# Patient Record
Sex: Female | Born: 1935 | Race: White | Hispanic: No | Smoking: Never smoker
Health system: Southern US, Community
[De-identification: ages and names within clinical notes are randomized; demographics above are authoritative.]

## PROBLEM LIST (undated history)

## (undated) DIAGNOSIS — I10 Essential (primary) hypertension: Secondary | ICD-10-CM

## (undated) DIAGNOSIS — E785 Hyperlipidemia, unspecified: Secondary | ICD-10-CM

## (undated) DIAGNOSIS — E079 Disorder of thyroid, unspecified: Secondary | ICD-10-CM

---

## 2008-02-21 ENCOUNTER — Emergency Department (HOSPITAL_COMMUNITY): Admission: EM | Admit: 2008-02-21 | Discharge: 2008-02-21 | Payer: Self-pay | Admitting: Family Medicine

## 2009-01-10 ENCOUNTER — Ambulatory Visit: Payer: Self-pay | Admitting: Diagnostic Radiology

## 2009-01-10 ENCOUNTER — Emergency Department (HOSPITAL_BASED_OUTPATIENT_CLINIC_OR_DEPARTMENT_OTHER): Admission: EM | Admit: 2009-01-10 | Discharge: 2009-01-10 | Payer: Self-pay | Admitting: Emergency Medicine

## 2010-11-30 LAB — CBC
HCT: 43.6 % (ref 36.0–46.0)
Hemoglobin: 15 g/dL (ref 12.0–15.0)
RBC: 4.85 MIL/uL (ref 3.87–5.11)
RDW: 12.1 % (ref 11.5–15.5)
WBC: 11.3 10*3/uL — ABNORMAL HIGH (ref 4.0–10.5)

## 2010-11-30 LAB — URINALYSIS, ROUTINE W REFLEX MICROSCOPIC
Bilirubin Urine: NEGATIVE
Ketones, ur: 15 mg/dL — AB
Nitrite: NEGATIVE
Protein, ur: NEGATIVE mg/dL

## 2010-11-30 LAB — DIFFERENTIAL
Eosinophils Relative: 0 % (ref 0–5)
Lymphocytes Relative: 11 % — ABNORMAL LOW (ref 12–46)
Lymphs Abs: 1.2 10*3/uL (ref 0.7–4.0)
Monocytes Absolute: 0.4 10*3/uL (ref 0.1–1.0)
Monocytes Relative: 4 % (ref 3–12)

## 2010-11-30 LAB — BASIC METABOLIC PANEL
GFR calc Af Amer: >60 mL/min (ref >60)
GFR calc non Af Amer: >60 mL/min (ref >60)
Glucose, Bld: 186 mg/dL — ABNORMAL HIGH (ref 70–99)
Potassium: 3.3 mEq/L — ABNORMAL LOW (ref 3.5–5.1)
Sodium: 139 mEq/L (ref 135–145)

## 2010-11-30 LAB — URINE CULTURE: Colony Count: 40000

## 2010-11-30 LAB — RAPID STREP SCREEN (MED CTR MEBANE ONLY): Streptococcus, Group A Screen (Direct): POSITIVE — AB

## 2012-03-16 ENCOUNTER — Encounter (HOSPITAL_BASED_OUTPATIENT_CLINIC_OR_DEPARTMENT_OTHER): Payer: Self-pay

## 2012-03-16 ENCOUNTER — Emergency Department (HOSPITAL_BASED_OUTPATIENT_CLINIC_OR_DEPARTMENT_OTHER)
Admission: EM | Admit: 2012-03-16 | Discharge: 2012-03-16 | Disposition: A | Discharge status: Home / Self Care | Payer: PRIVATE HEALTH INSURANCE | Attending: Emergency Medicine | Admitting: Emergency Medicine

## 2012-03-16 ENCOUNTER — Emergency Department (HOSPITAL_BASED_OUTPATIENT_CLINIC_OR_DEPARTMENT_OTHER): Payer: PRIVATE HEALTH INSURANCE

## 2012-03-16 DIAGNOSIS — M25519 Pain in unspecified shoulder: Secondary | ICD-10-CM | POA: Insufficient documentation

## 2012-03-16 DIAGNOSIS — E119 Type 2 diabetes mellitus without complications: Secondary | ICD-10-CM | POA: Insufficient documentation

## 2012-03-16 DIAGNOSIS — Z880 Allergy status to penicillin: Secondary | ICD-10-CM | POA: Insufficient documentation

## 2012-03-16 DIAGNOSIS — M25511 Pain in right shoulder: Secondary | ICD-10-CM

## 2012-03-16 DIAGNOSIS — Z886 Allergy status to analgesic agent status: Secondary | ICD-10-CM | POA: Insufficient documentation

## 2012-03-16 DIAGNOSIS — E079 Disorder of thyroid, unspecified: Secondary | ICD-10-CM | POA: Insufficient documentation

## 2012-03-16 DIAGNOSIS — E785 Hyperlipidemia, unspecified: Secondary | ICD-10-CM | POA: Insufficient documentation

## 2012-03-16 DIAGNOSIS — I10 Essential (primary) hypertension: Secondary | ICD-10-CM | POA: Insufficient documentation

## 2012-03-16 HISTORY — DX: Disorder of thyroid, unspecified: E07.9

## 2012-03-16 HISTORY — DX: Essential (primary) hypertension: I10

## 2012-03-16 HISTORY — DX: Hyperlipidemia, unspecified: E78.5

## 2012-03-16 MED ORDER — TRAMADOL HCL 50 MG PO TABS: 50.0000 mg | ORAL_TABLET | Freq: Three times a day (TID) | ORAL | Status: AC | PRN

## 2012-03-16 MED ORDER — TRAMADOL HCL 50 MG PO TABS
50.0000 mg | ORAL_TABLET | Freq: Once | ORAL | Status: AC
  Administered 2012-03-16: 50 mg via ORAL
  Filled 2012-03-16: qty 1

## 2012-03-16 NOTE — ED Notes (Signed)
Patient transported to X-ray 

## 2012-03-16 NOTE — ED Provider Notes (Signed)
History     CSN: [id number]  Arrival date & time 03/16/12  1191   First MD Initiated Contact with Patient 03/16/12 [phone]      Chief Complaint  Patient presents with  . Shoulder Pain     HPI The patient presents with concerns of right shoulder pain.  She notes the pain began insidiously approximately 3 months ago.  Since onset has been persistent.  Pain is worse in her shoulder when she sleeps on that side.  She states the pain is more tolerable during the day when she is active.  The pain is dull, sore, minimally improved with ibuprofen. The patient denies any distal dysesthesia or weakness.  She denies any focal complaints. She has been seen by her primary care physician, was diagnosed with arthritis.  Past Medical History  Diagnosis Date  . Diabetes mellitus   . Hypertension   . Thyroid disease   . Hyperlipidemia     History reviewed. No pertinent past surgical history.  No family history on file.  History  Substance Use Topics  . Smoking status: Never Smoker   . Smokeless tobacco: Never Used  . Alcohol Use: 0.6 oz/week    1 Glasses of wine per week     nightly    OB History    Grav Para Term Preterm Abortions TAB SAB Ect Mult Living                  Review of Systems  All other systems reviewed and are negative.    Allergies  Codeine and Sulfa antibiotics  Home Medications   Current Outpatient Rx  Name Route Sig Dispense Refill  . EZETIMIBE 10 MG PO TABS Oral Take 10 mg by mouth daily.    Marland Kitchen LEVOTHYROXINE SODIUM 75 MCG PO TABS Oral Take 75 mcg by mouth daily.    Marland Kitchen LOSARTAN POTASSIUM-HCTZ 100-25 MG PO TABS Oral Take 1 tablet by mouth daily.    Marland Kitchen METFORMIN HCL 500 MG PO TABS Oral Take 500 mg by mouth 2 (two) times daily with a meal.    . OMEPRAZOLE 20 MG PO CPDR Oral Take 20 mg by mouth daily.    . TRIAMTERENE-HCTZ 37.5-25 MG PO CAPS Oral Take 1 capsule by mouth every morning.    Marland Kitchen TRAMADOL HCL 50 MG PO TABS Oral Take 1 tablet (50 mg total) by mouth  every 8 (eight) hours as needed for pain. 30 tablet 0    BP 145/63  Pulse 74  Temp 97.9 F (36.6 C) (Oral)  Resp 16  Ht 5' (1.524 m)  Wt 135 lb (61.236 kg)  BMI 26.37 kg/m2  SpO2 100%  Physical Exam  Nursing note and vitals reviewed. Constitutional: She appears well-developed and well-nourished. No distress.  HENT:  Head: Normocephalic and atraumatic.  Eyes: Conjunctivae are normal. Pupils are equal, round, and reactive to light.  Cardiovascular: Normal rate, regular rhythm and intact distal pulses.   Pulmonary/Chest: Effort normal and breath sounds normal. No stridor.  Musculoskeletal:       Right shoulder: She exhibits decreased range of motion, tenderness, bony tenderness and pain. She exhibits no swelling, no effusion, no crepitus, no deformity, no laceration, no spasm, normal pulse and normal strength.       Left shoulder: Normal.       Patient cannot abduct the arm past 90 internal rotation is limited approximately 30% against the contralateral arm Strength is 5/5 in all dimensions the  Skin: She is not diaphoretic.  ED Course  Procedures (including critical care time)  Labs Reviewed - No data to display Dg Shoulder Right  03/16/2012  *RADIOLOGY REPORT*  Clinical Data: Right shoulder pain and tenderness  RIGHT SHOULDER - 2+ VIEW  Comparison: None.  Findings: No fracture or dislocation.  Mild degenerative change of the glenohumeral joint is suspected.  There is mild degenerative change of the [facility] joint with joint space loss and inferiorly- directed osteophytosis.  No definite evidence of calcific tendonitis.  Regional soft tissues are normal.  Limited visualization of the adjacent thorax chests mild diffuse thickening of the pulmonary interstitium.  Regional soft tissues are normal.  IMPRESSION: Mild degenerative change of the glenohumeral and acromioclavicular joints.  Original Report Authenticated By: Waynard Reeds, M.D.     1. Shoulder pain, right       MDM    This pleasant elderly female presents with months of right shoulder pain.  On exam the patient is in no distress.  She has an exam notable for limited range of motion, but no loss of strength or neurovascular function.  The patient's x-ray demonstrated arthritic changes, no focal findings.  The patient was referred to an orthopedist for continued evaluation and management.     Gerhard Munch, MD 03/16/12 1248

## 2012-03-16 NOTE — ED Notes (Signed)
Right shoulder pain x 3 months.  Denies injury and has been seen by PMD.

## 2016-05-10 ENCOUNTER — Emergency Department (HOSPITAL_COMMUNITY): Payer: Medicare (Managed Care)

## 2016-05-10 ENCOUNTER — Emergency Department (HOSPITAL_COMMUNITY)
Admission: EM | Admit: 2016-05-10 | Discharge: 2016-05-10 | Disposition: A | Discharge status: Home / Self Care | Payer: Medicare (Managed Care) | Attending: Emergency Medicine | Admitting: Emergency Medicine

## 2016-05-10 ENCOUNTER — Encounter (HOSPITAL_COMMUNITY): Payer: Self-pay

## 2016-05-10 DIAGNOSIS — E119 Type 2 diabetes mellitus without complications: Secondary | ICD-10-CM | POA: Insufficient documentation

## 2016-05-10 DIAGNOSIS — R109 Unspecified abdominal pain: Secondary | ICD-10-CM

## 2016-05-10 DIAGNOSIS — I10 Essential (primary) hypertension: Secondary | ICD-10-CM | POA: Insufficient documentation

## 2016-05-10 DIAGNOSIS — K529 Noninfective gastroenteritis and colitis, unspecified: Secondary | ICD-10-CM | POA: Diagnosis not present

## 2016-05-10 DIAGNOSIS — Z79899 Other long term (current) drug therapy: Secondary | ICD-10-CM | POA: Insufficient documentation

## 2016-05-10 DIAGNOSIS — R1084 Generalized abdominal pain: Secondary | ICD-10-CM | POA: Diagnosis present

## 2016-05-10 DIAGNOSIS — K2970 Gastritis, unspecified, without bleeding: Secondary | ICD-10-CM | POA: Diagnosis not present

## 2016-05-10 DIAGNOSIS — Z7982 Long term (current) use of aspirin: Secondary | ICD-10-CM | POA: Diagnosis not present

## 2016-05-10 LAB — CBC
HCT: 42.8 % (ref 36.0–46.0)
Hemoglobin: 14.5 g/dL (ref 12.0–15.0)
MCH: 30.5 pg (ref 26.0–34.0)
MCHC: 33.9 g/dL (ref 30.0–36.0)
MCV: 90.1 fL (ref 78.0–100.0)
Platelets: 253 10*3/uL (ref 150–400)
RBC: 4.75 MIL/uL (ref 3.87–5.11)
RDW: 12.6 % (ref 11.5–15.5)
WBC: 8.5 10*3/uL (ref 4.0–10.5)

## 2016-05-10 LAB — COMPREHENSIVE METABOLIC PANEL
ALT: 11 U/L — ABNORMAL LOW (ref 14–54)
ANION GAP: 12 (ref 5–15)
AST: 19 U/L (ref 15–41)
Albumin: 4.1 g/dL (ref 3.5–5.0)
Alkaline Phosphatase: 43 U/L (ref 38–126)
BILIRUBIN TOTAL: 1.2 mg/dL (ref 0.3–1.2)
BUN: 11 mg/dL (ref 6–20)
CO2: 30 mmol/L (ref 22–32)
Calcium: 10.3 mg/dL (ref 8.9–10.3)
Chloride: 97 mmol/L — ABNORMAL LOW (ref 101–111)
Creatinine, Ser: 0.67 mg/dL (ref 0.44–1.00)
Glucose, Bld: 142 mg/dL — ABNORMAL HIGH (ref 65–99)
POTASSIUM: 3.1 mmol/L — AB (ref 3.5–5.1)
Sodium: 139 mmol/L (ref 135–145)
TOTAL PROTEIN: 8.3 g/dL — AB (ref 6.5–8.1)

## 2016-05-10 LAB — DIFFERENTIAL
BASOS ABS: 0 10*3/uL (ref 0.0–0.1)
Basophils Relative: 0 %
Eosinophils Absolute: 0 10*3/uL (ref 0.0–0.7)
Eosinophils Relative: 0 %
LYMPHS PCT: 17 %
Lymphs Abs: 1.5 10*3/uL (ref 0.7–4.0)
MONO ABS: 0.4 10*3/uL (ref 0.1–1.0)
Monocytes Relative: 5 %
NEUTROS ABS: 6.6 10*3/uL (ref 1.7–7.7)
NEUTROS PCT: 78 %

## 2016-05-10 LAB — I-STAT CG4 LACTIC ACID, ED
LACTIC ACID, VENOUS: 2.41 mmol/L — AB (ref 0.5–1.9)
LACTIC ACID, VENOUS: 1.2 mmol/L (ref 0.5–1.9)

## 2016-05-10 LAB — LIPASE, BLOOD: Lipase: 25 U/L (ref 11–51)

## 2016-05-10 LAB — I-STAT TROPONIN, ED: TROPONIN I, POC: 0 ng/mL (ref 0.00–0.08)

## 2016-05-10 MED ORDER — MORPHINE SULFATE (PF) 4 MG/ML IV SOLN
4.0000 mg | Freq: Once | INTRAVENOUS | Status: AC
  Administered 2016-05-10: 4 mg via INTRAVENOUS
  Filled 2016-05-10: qty 1

## 2016-05-10 MED ORDER — CIPROFLOXACIN HCL 500 MG PO TABS: 500.0000 mg | ORAL_TABLET | Freq: Two times a day (BID) | ORAL | 0 refills | Status: AC

## 2016-05-10 MED ORDER — PROMETHAZINE HCL 25 MG RE SUPP: 25.0000 mg | Freq: Four times a day (QID) | RECTAL | 0 refills | Status: AC | PRN

## 2016-05-10 MED ORDER — METRONIDAZOLE 500 MG PO TABS: 500.0000 mg | ORAL_TABLET | Freq: Two times a day (BID) | ORAL | 0 refills | Status: AC

## 2016-05-10 MED ORDER — SODIUM CHLORIDE 0.9 % IV BOLUS (SEPSIS)
500.0000 mL | Freq: Once | INTRAVENOUS | Status: AC
  Administered 2016-05-10: 500 mL via INTRAVENOUS

## 2016-05-10 MED ORDER — ONDANSETRON HCL 4 MG/2ML IJ SOLN
4.0000 mg | Freq: Once | INTRAMUSCULAR | Status: AC
  Administered 2016-05-10: 4 mg via INTRAVENOUS
  Filled 2016-05-10: qty 2

## 2016-05-10 MED ORDER — HYDROCODONE-ACETAMINOPHEN 7.5-325 MG/15ML PO SOLN: 10.0000 mL | Freq: Four times a day (QID) | ORAL | 0 refills | Status: AC | PRN

## 2016-05-10 MED ORDER — IOPAMIDOL (ISOVUE-370) INJECTION 76%
INTRAVENOUS | Status: AC
  Administered 2016-05-10: 100 mL
  Filled 2016-05-10: qty 100

## 2016-05-10 MED ORDER — SODIUM CHLORIDE 0.9 % IV BOLUS (SEPSIS)
1000.0000 mL | Freq: Once | INTRAVENOUS | Status: AC
  Administered 2016-05-10: 1000 mL via INTRAVENOUS

## 2016-05-10 MED ORDER — MORPHINE SULFATE (PF) 2 MG/ML IV SOLN
2.0000 mg | Freq: Once | INTRAVENOUS | Status: AC
  Administered 2016-05-10: 2 mg via INTRAVENOUS
  Filled 2016-05-10: qty 1

## 2016-05-10 MED ORDER — METOCLOPRAMIDE HCL 5 MG/ML IJ SOLN
10.0000 mg | Freq: Once | INTRAMUSCULAR | Status: AC
  Administered 2016-05-10: 10 mg via INTRAVENOUS
  Filled 2016-05-10: qty 2

## 2016-05-10 MED ORDER — SODIUM CHLORIDE 0.9 % IV SOLN
Freq: Once | INTRAVENOUS | Status: AC
  Administered 2016-05-10: 19:00:00 via INTRAVENOUS

## 2016-05-10 NOTE — ED Triage Notes (Signed)
Patient here with severe LLQ abdominal pain since 0200, awoke her from sleep. Vomited multiple times, no diarrhea. Last BM yesterday. No GI history. Patient restless on arrival and crying out in pain and guarding abdomen. Alert and oriented. [place] from [place]

## 2016-05-10 NOTE — Discharge Instructions (Signed)
Please return without fail for worsening symptoms, including fever, intractable vomiting, escalating pain, or any other symptoms concerning to you. Please follow-up closely with a primary care doctor.  This is the result of the CT read:    CLINICAL DATA:  Acute onset of lower abdominal pain, nausea, vomiting and back pain. Initial encounter.  EXAM: CTA ABDOMEN AND PELVIS wITHOUT AND WITH CONTRAST  TECHNIQUE: Multidetector CT imaging of the abdomen and pelvis was performed using the standard protocol during bolus administration of intravenous contrast. Multiplanar reconstructed images and MIPs were obtained and reviewed to evaluate the vascular anatomy.  CONTRAST:  100 mL of Isovue 370 IV contrast  COMPARISON:  Abdominal radiograph performed earlier today at 5:28 p.m.  FINDINGS: VASCULAR  Aorta: Scattered calcification is noted along the abdominal aorta and its branches. There is no evidence of mural thrombus or significant luminal narrowing. There is no evidence of aortic dissection or aneurysmal dilatation.  Celiac: There is slight narrowing at the origin of the celiac trunk, with mild associated calcification. The celiac trunk is otherwise patent.  SMA: There is slight narrowing at the origin of the superior mesenteric artery, with mild associated calcification. The superior mesenteric artery remains patent.  Renals: There is calcification at the proximal renal arteries bilaterally, though they remain patent.  IMA: The inferior mesenteric artery is grossly patent.  Inflow: Scattered calcification is noted along the common iliac arteries and internal iliac arteries bilaterally. The common femoral arteries are unremarkable in appearance.  Proximal Outflow: The visualized portions of profunda femoris and superficial femoral arteries are unremarkable.  Veins: The visualized venous structures are grossly unremarkable in appearance.  Review of the MIP images confirms  the above findings.  NON-VASCULAR  Lower chest: Minimal bibasilar atelectasis is noted. Mild bronchiectasis is noted at both lower lobes. Diffuse coronary artery calcifications are seen.  Hepatobiliary: The liver is unremarkable in appearance. The gallbladder is unremarkable in appearance. The common bile duct remains normal in caliber.  Pancreas: The pancreatic duct is somewhat prominent, measuring up to 4 mm in diameter. The pancreas is otherwise unremarkable.  Spleen: The spleen is unremarkable in appearance.  Adrenals/Urinary Tract: The adrenal glands are unremarkable in appearance. The kidneys are within normal limits. There is no evidence of hydronephrosis. No renal or ureteral stones are identified, though evaluation for stones is limited given contrast in the renal calyces. No perinephric stranding is seen.  Stomach/Bowel: Note is made of diffuse wall thickening involving the gastric antrum and pylorus, concerning for acute gastritis. The gastric fundus is grossly unremarkable.  There is a long segment of wall thickening involving the mid ileum, with more proximal fecalization of the small bowel, reflecting associated dysmotility. Underlying mesenteric edema is seen.  Remaining visualized small bowel is unremarkable. The cecum is flipped anteriorly. The colon is grossly unremarkable in appearance.  Lymphatic: No retroperitoneal or pelvic sidewall lymphadenopathy is seen.  Reproductive: The bladder is mildly distended and grossly unremarkable. The uterus is grossly unremarkable in appearance. The ovaries are relatively symmetric. No suspicious adnexal masses are seen.  Other: Trace ascites is noted surrounding the liver, and also within the pelvis.  Musculoskeletal: No acute osseous abnormalities are identified. The visualized musculature is unremarkable in appearance.  IMPRESSION: VASCULAR  1. No evidence of aortic dissection. No evidence of  aneurysmal dilatation. Scattered calcification along the abdominal aorta and its branches. 2. Slight narrowing at the origin of the celiac trunk and superior mesenteric artery, with mild associated calcification. Calcification at the proximal renal  arteries bilaterally.  NON-VASCULAR  1. Diffuse wall thickening involving the gastric antrum and pylorus, concerning for acute gastritis. Would correlate with the patient's symptoms. 2. Long segment of wall thickening involving the mid ileum, with more proximal fecalization of the small bowel, reflecting associated dysmotility. Underlying mesenteric edema noted. This may reflect acute infectious or inflammatory ileitis. The appearance is less typical for ischemia, though it cannot be excluded. 3. Prominence of the pancreatic duct to 4 mm, of uncertain significance. Would correlate with pancreatic lab values. 4. Mild bronchiectasis at the lower lung lobes, with minimal bibasilar atelectasis. 5. Diffuse coronary artery calcifications seen. 6. Trace ascites noted within the abdomen and pelvis.

## 2016-05-10 NOTE — ED Notes (Signed)
Pt still in pain ER MD made aware orders received and carried out

## 2016-05-10 NOTE — ED Provider Notes (Signed)
[facility]-EMERGENCY DEPT Provider Note   CSN: [id number] Arrival date & time: 05/10/16  1653     History   Chief Complaint Chief Complaint  Patient presents with  . Abdominal Pain  . Emesis    HPI Christine Gillespie is a 82 y.o. female.  The history is provided by the patient.  Abdominal Pain   This is a new problem. The current episode started 12 to 24 hours ago. The problem occurs constantly. The problem has been gradually worsening. The pain is associated with eating. The pain is located in the generalized abdominal region and epigastric region. The pain is at a severity of 10/10. The pain is severe. Associated symptoms include nausea, vomiting and constipation. Pertinent negatives include fever, diarrhea, dysuria, frequency and hematuria. Nothing aggravates the symptoms. Nothing relieves the symptoms.  Emesis   Associated symptoms include abdominal pain. Pertinent negatives include no diarrhea and no fever.   No prior abdominal surgeries. Never had similar pain. Sudden onset severe abdominal pain with nausea vomiting and not passing gas. Last BM yesterday. No bloody stools.. Past Medical History:  Diagnosis Date  . Diabetes mellitus   . Hyperlipidemia   . Hypertension   . Thyroid disease     There are no active problems to display for this patient.   History reviewed. No pertinent surgical history.  OB History    No data available       Home Medications    Prior to Admission medications   Medication Sig Start Date End Date Taking? Authorizing Provider  aspirin EC 81 MG tablet Take 81 mg by mouth daily.   Yes Historical Provider, MD  clopidogrel (PLAVIX) 75 MG tablet Take 75 mg by mouth daily.   Yes Historical Provider, MD  levothyroxine (SYNTHROID, LEVOTHROID) 75 MCG tablet Take 75 mcg by mouth daily.   Yes Historical Provider, MD  losartan-hydrochlorothiazide (HYZAAR) 50-12.5 MG tablet Take 1 tablet by mouth daily.   Yes Historical Provider, MD  pantoprazole  (PROTONIX) 40 MG tablet Take 40 mg by mouth daily.   Yes Historical Provider, MD  triamterene-hydrochlorothiazide (MAXZIDE-25) 37.5-25 MG tablet Take 1 tablet by mouth daily.   Yes Historical Provider, MD  ciprofloxacin (CIPRO) 500 MG tablet Take 1 tablet (500 mg total) by mouth every 12 (twelve) hours. 05/10/16   Lavera Guise, MD  HYDROcodone-acetaminophen (HYCET) 7.5-325 mg/15 ml solution Take 10 mLs by mouth 4 (four) times daily as needed for moderate pain or severe pain. 05/10/16 05/10/17  Lavera Guise, MD  metroNIDAZOLE (FLAGYL) 500 MG tablet Take 1 tablet (500 mg total) by mouth 2 (two) times daily. 05/10/16   Lavera Guise, MD  promethazine (PHENERGAN) 25 MG suppository Place 1 suppository (25 mg total) rectally every 6 (six) hours as needed for nausea or vomiting. 05/10/16   Lavera Guise, MD    Family History No family history on file. Reviewed. Noncontributory  Social History Social History  Substance Use Topics  . Smoking status: Never Smoker  . Smokeless tobacco: Never Used  . Alcohol use 0.6 oz/week    1 Glasses of wine per week     Comment: nightly     Allergies   Codeine; Tape; and Sulfa antibiotics   Review of Systems Review of Systems  Constitutional: Negative for fever.  Gastrointestinal: Positive for abdominal pain, constipation, nausea and vomiting. Negative for diarrhea.  Genitourinary: Negative for dysuria, frequency and hematuria.  All other systems reviewed and are negative.    Physical Exam Updated  Vital Signs BP 123/74   Pulse 75   Temp 98.2 F (36.8 C) (Oral)   Resp 17   SpO2 97%   Physical Exam Physical Exam  Nursing note and vitals reviewed. Constitutional: appears to be in significant pain Head: Normocephalic and atraumatic.  Mouth/Throat: Oropharynx is clear and dry.  Neck: Normal range of motion. Neck supple.  Cardiovascular: Normal rate and regular rhythm.   Pulmonary/Chest: Effort normal and breath sounds normal.  Abdominal: Soft. Mild  distension. Diffuse tenderness, worst in epigastrium. There is no rebound and no guarding.  Musculoskeletal: Normal range of motion.  Neurological: Alert, no facial droop, fluent speech, moves all extremities symmetrically Skin: Skin is warm and dry.  Psychiatric: Cooperative   ED Treatments / Results  Labs (all labs ordered are listed, but only abnormal results are displayed) Labs Reviewed  COMPREHENSIVE METABOLIC PANEL - Abnormal; Notable for the following:       Result Value   Potassium 3.1 (*)    Chloride 97 (*)    Glucose, Bld 142 (*)    Total Protein 8.3 (*)    ALT 11 (*)    All other components within normal limits  I-STAT CG4 LACTIC ACID, ED - Abnormal; Notable for the following:    Lactic Acid, Venous 2.41 (*)    All other components within normal limits  LIPASE, BLOOD  CBC  DIFFERENTIAL  I-STAT TROPOININ, ED  I-STAT CG4 LACTIC ACID, ED    EKG  EKG Interpretation  Date/Time:  Tuesday May 10 2016 17:09:59 EDT Ventricular Rate:  56 PR Interval:    QRS Duration: 80 QT Interval:  445 QTC Calculation: 430 R Axis:   13 Text Interpretation:  Age not entered, assumed to be  82 years old for purpose of ECG interpretation Sinus rhythm Low voltage, precordial leads Consider anterior infarct No acute ischemic changes  Confirmed by Jabbar Palmero MD, Ervin Hensley [phone]([id number]) on 05/10/2016 6:09:13 PM       Radiology Dg Abd Portable 1v  Result Date: 05/10/2016 CLINICAL DATA:  Acute onset severe abdominal pain with nausea and vomiting. EXAM: PORTABLE ABDOMEN - 1 VIEW COMPARISON:  01/10/2009 FINDINGS: No evidence of ileus or obstruction. Large amount of fecal matter in the colon, upper limits of normal. No abnormal calcifications other than vascular calcification. No acute bone finding. IMPRESSION: No definitely pathologic finding. Fairly large amount of fecal matter in the colon. Electronically Signed   By: Paulina FusiMark  Shogry M.D.   On: 05/10/2016 17:42   Ct Angio Abd/pel W And/or Wo  Contrast  Result Date: 05/10/2016 CLINICAL DATA:  Acute onset of lower abdominal pain, nausea, vomiting and back pain. Initial encounter. EXAM: CTA ABDOMEN AND PELVIS wITHOUT AND WITH CONTRAST TECHNIQUE: Multidetector CT imaging of the abdomen and pelvis was performed using the standard protocol during bolus administration of intravenous contrast. Multiplanar reconstructed images and MIPs were obtained and reviewed to evaluate the vascular anatomy. CONTRAST:  100 mL of Isovue 370 IV contrast COMPARISON:  Abdominal radiograph performed earlier today at 5:28 p.m. FINDINGS: VASCULAR Aorta: Scattered calcification is noted along the abdominal aorta and its branches. There is no evidence of mural thrombus or significant luminal narrowing. There is no evidence of aortic dissection or aneurysmal dilatation. Celiac: There is slight narrowing at the origin of the celiac trunk, with mild associated calcification. The celiac trunk is otherwise patent. SMA: There is slight narrowing at the origin of the superior mesenteric artery, with mild associated calcification. The superior mesenteric artery remains patent. Renals: There is  calcification at the proximal renal arteries bilaterally, though they remain patent. IMA: The inferior mesenteric artery is grossly patent. Inflow: Scattered calcification is noted along the common iliac arteries and internal iliac arteries bilaterally. The common femoral arteries are unremarkable in appearance. Proximal Outflow: The visualized portions of profunda femoris and superficial femoral arteries are unremarkable. Veins: The visualized venous structures are grossly unremarkable in appearance. Review of the MIP images confirms the above findings. NON-VASCULAR Lower chest: Minimal bibasilar atelectasis is noted. Mild bronchiectasis is noted at both lower lobes. Diffuse coronary artery calcifications are seen. Hepatobiliary: The liver is unremarkable in appearance. The gallbladder is  unremarkable in appearance. The common bile duct remains normal in caliber. Pancreas: The pancreatic duct is somewhat prominent, measuring up to 4 mm in diameter. The pancreas is otherwise unremarkable. Spleen: The spleen is unremarkable in appearance. Adrenals/Urinary Tract: The adrenal glands are unremarkable in appearance. The kidneys are within normal limits. There is no evidence of hydronephrosis. No renal or ureteral stones are identified, though evaluation for stones is limited given contrast in the renal calyces. No perinephric stranding is seen. Stomach/Bowel: Note is made of diffuse wall thickening involving the gastric antrum and pylorus, concerning for acute gastritis. The gastric fundus is grossly unremarkable. There is a long segment of wall thickening involving the mid ileum, with more proximal fecalization of the small bowel, reflecting associated dysmotility. Underlying mesenteric edema is seen. Remaining visualized small bowel is unremarkable. The cecum is flipped anteriorly. The colon is grossly unremarkable in appearance. Lymphatic: No retroperitoneal or pelvic sidewall lymphadenopathy is seen. Reproductive: The bladder is mildly distended and grossly unremarkable. The uterus is grossly unremarkable in appearance. The ovaries are relatively symmetric. No suspicious adnexal masses are seen. Other: Trace ascites is noted surrounding the liver, and also within the pelvis. Musculoskeletal: No acute osseous abnormalities are identified. The visualized musculature is unremarkable in appearance. IMPRESSION: VASCULAR 1. No evidence of aortic dissection. No evidence of aneurysmal dilatation. Scattered calcification along the abdominal aorta and its branches. 2. Slight narrowing at the origin of the celiac trunk and superior mesenteric artery, with mild associated calcification. Calcification at the proximal renal arteries bilaterally. NON-VASCULAR 1. Diffuse wall thickening involving the gastric antrum  and pylorus, concerning for acute gastritis. Would correlate with the patient's symptoms. 2. Long segment of wall thickening involving the mid ileum, with more proximal fecalization of the small bowel, reflecting associated dysmotility. Underlying mesenteric edema noted. This may reflect acute infectious or inflammatory ileitis. The appearance is less typical for ischemia, though it cannot be excluded. 3. Prominence of the pancreatic duct to 4 mm, of uncertain significance. Would correlate with pancreatic lab values. 4. Mild bronchiectasis at the lower lung lobes, with minimal bibasilar atelectasis. 5. Diffuse coronary artery calcifications seen. 6. Trace ascites noted within the abdomen and pelvis. Electronically Signed   By: Roanna Raider M.D.   On: 05/10/2016 19:59    Procedures Procedures (including critical care time)  Medications Ordered in ED Medications  morphine 4 MG/ML injection 4 mg (4 mg Intravenous Given 05/10/16 1725)  ondansetron (ZOFRAN) injection 4 mg (4 mg Intravenous Given 05/10/16 1723)  sodium chloride 0.9 % bolus 500 mL (0 mLs Intravenous Stopped 05/10/16 1833)  0.9 %  sodium chloride infusion ( Intravenous Stopped 05/10/16 2056)  sodium chloride 0.9 % bolus 1,000 mL (0 mLs Intravenous Stopped 05/10/16 2056)  morphine 2 MG/ML injection 2 mg (2 mg Intravenous Given 05/10/16 1835)  metoCLOPramide (REGLAN) injection 10 mg (10 mg Intravenous Given 05/10/16  1840)  iopamidol (ISOVUE-370) 76 % injection (100 mLs  Contrast Given 05/10/16 1906)     Initial Impression / Assessment and Plan / ED Course  I have reviewed the triage vital signs and the nursing notes.  Pertinent labs & imaging results that were available during my care of the patient were reviewed by me and considered in my medical decision making (see chart for details).  Clinical Course    82 year old female with diabetes, HTN, HLD who presents with abdominal pain, nausea and vomiting. Afebrile, hemodynamically stable.  With soft abdomen, but diffusely tender, worst periumbilically and epigastric.   Differential includes obstruction/volvulous, pancreatitis, ischemic vs infectious colitis, aortic pathology, or other infectious or surgical intraabdominal process. Minimally elevated lactate acid of 2.1 that clears after IVF. No significant leukocytosis. Normal lipase, No acute hepatobiliary pathology. CT abd/pelvis w/ gastritis and ilieitis. Likely infectious etiology.   Symptoms improved after IVF, analgesics, and antiemetics. She feels better. Offered admission for observation overnight for antibiotics, serial abdominal exam/pain control given age. After some discussion with patient and family, they feel she would be more comfortable managing symptoms from home. Family very reliable and would bring back to ED for worsening symptoms. This is very reasonable. Will treat with cipro/flagyl. Will follow-up closely with outpatient doctor as needed. Strict return and follow-up instructions reviewed. They expressed understanding of all discharge instructions and felt comfortable with the plan of care.   Final Clinical Impressions(s) / ED Diagnoses   Final diagnoses:  Gastritis  Ileitis    New Prescriptions Discharge Medication List as of 05/10/2016  8:33 PM    START taking these medications   Details  ciprofloxacin (CIPRO) 500 MG tablet Take 1 tablet (500 mg total) by mouth every 12 (twelve) hours., Starting Tue 05/10/2016, Print    HYDROcodone-acetaminophen (HYCET) 7.5-325 mg/15 ml solution Take 10 mLs by mouth 4 (four) times daily as needed for moderate pain or severe pain., Starting Tue 05/10/2016, Until Wed 05/10/2017, Print    metroNIDAZOLE (FLAGYL) 500 MG tablet Take 1 tablet (500 mg total) by mouth 2 (two) times daily., Starting Tue 05/10/2016, Print    promethazine (PHENERGAN) 25 MG suppository Place 1 suppository (25 mg total) rectally every 6 (six) hours as needed for nausea or vomiting., Starting Tue  05/10/2016, Print         Lavera Guise, MD 05/11/16 [phone]

## 2016-05-10 NOTE — ED Notes (Signed)
Patient transported to CT 

## 2016-05-10 NOTE — ED Notes (Signed)
Pt appears in distress c/o abdominal pain medicine given will continue to monitor family at bedside

## 2017-11-04 IMAGING — CT CT CTA ABD/PEL W/CM AND/OR W/O CM
2 of 9 series · 10 of 46 positions shown, 15 images · IV contrast (APPLIED)
Comparison: Abdominal radiograph performed earlier today at [DATE]
p.m.

CLINICAL DATA: Acute onset of lower abdominal pain, nausea,
vomiting and back pain. Initial encounter.

EXAM:
CTA ABDOMEN AND PELVIS wITHOUT AND WITH CONTRAST
TECHNIQUE: Multidetector CT imaging of the abdomen and pelvis was performed
using the standard protocol during bolus administration of
intravenous contrast. Multiplanar reconstructed images and MIPs were
obtained and reviewed to evaluate the vascular anatomy.
CONTRAST:  100 mL of Isovue 370 IV contrast

[Series 6: coronals · coronal · 0.59mm/px · 2 of 107 slices shown]
[im 36/107  soft-tissue]
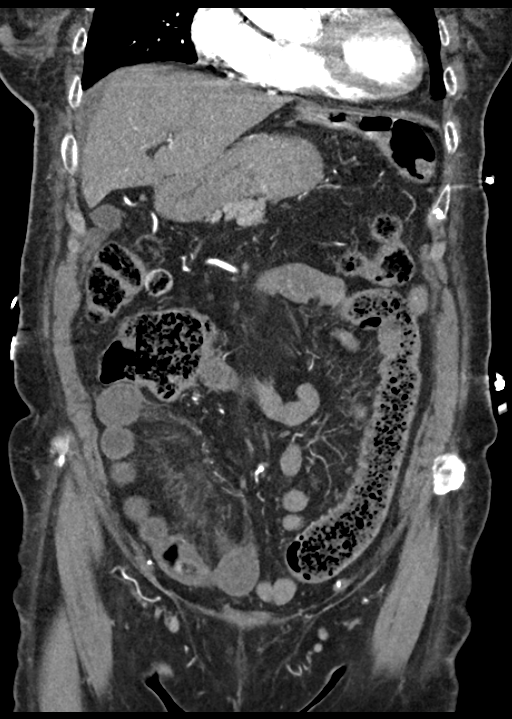
[im 71/107  soft-tissue]
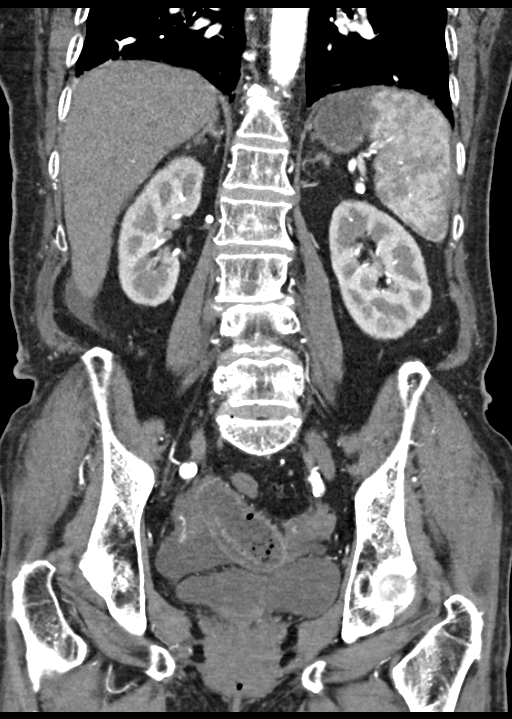

[Series 10: venous 5.0 i30f 1 · axial · portal-venous · 0.60mm/px · z∈[-650,-310]mm · 8 of 86 slices shown, 13 images]
[im 9/86  soft-tissue]
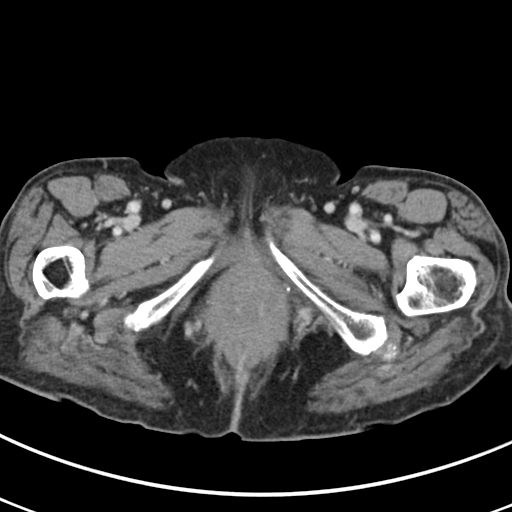
[im 9/86  bone]
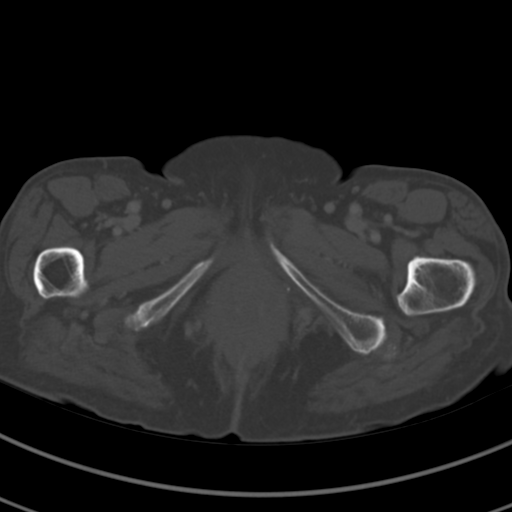
[im 18/86  soft-tissue]
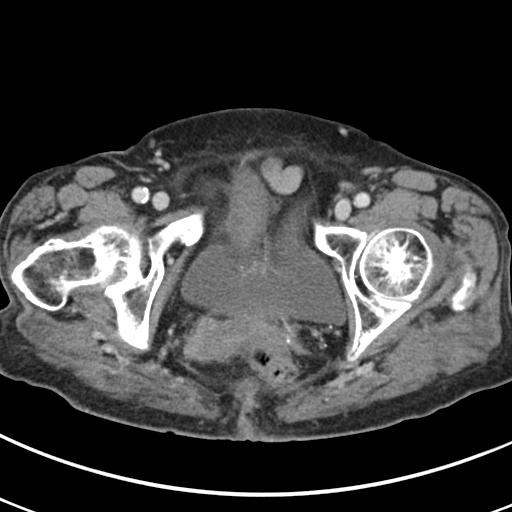
[im 26/86  soft-tissue]
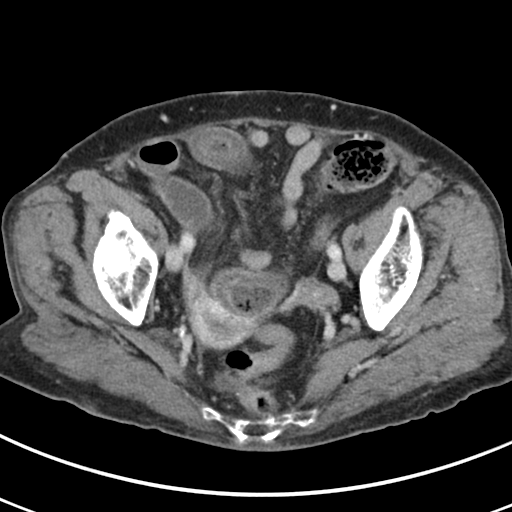
[im 35/86  soft-tissue]
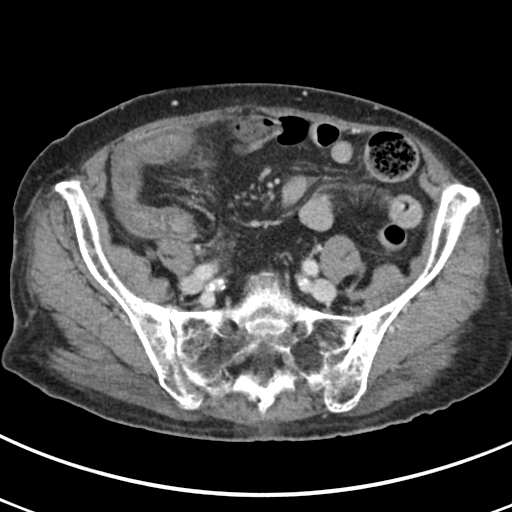
[im 52/86  soft-tissue]
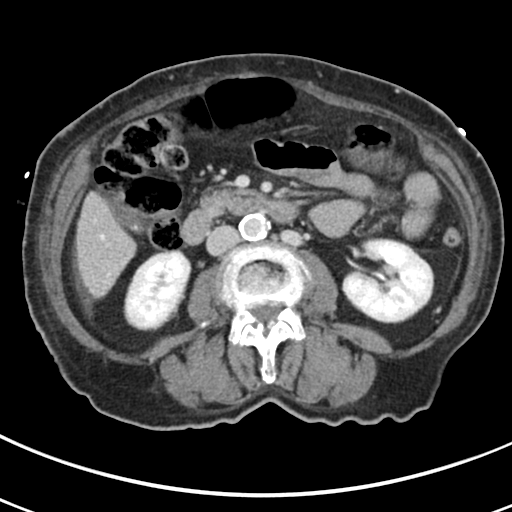
[im 52/86  lung]
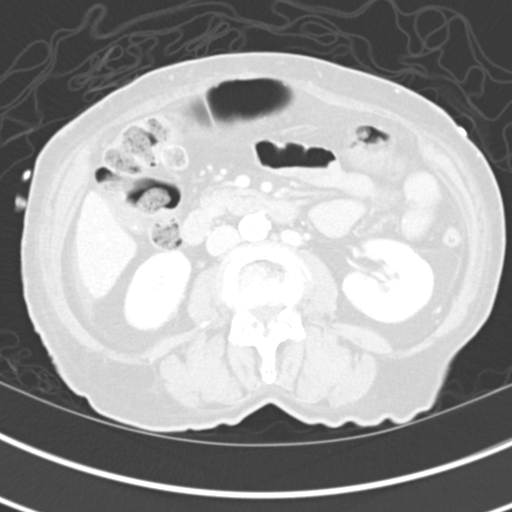
[im 60/86  soft-tissue]
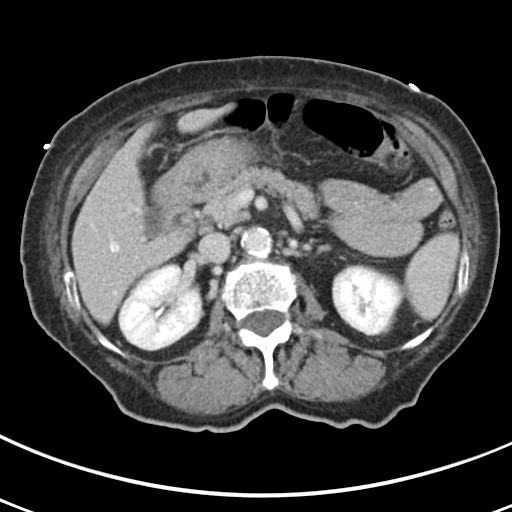
[im 60/86  lung]
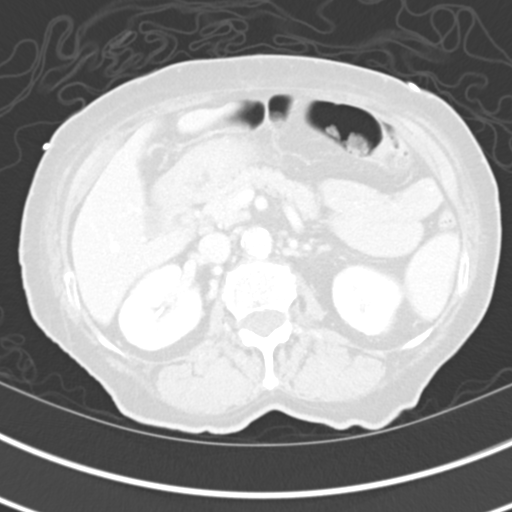
[im 69/86  soft-tissue]
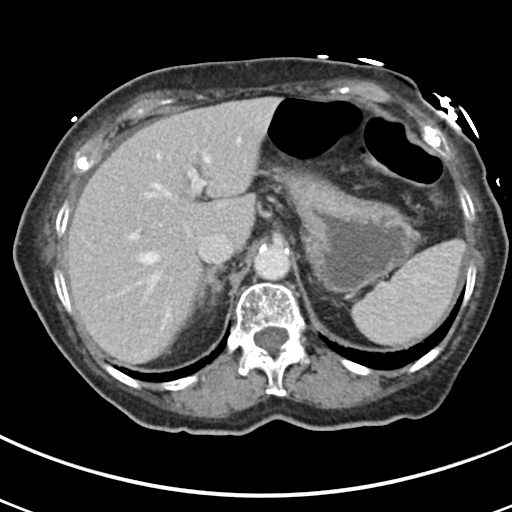
[im 69/86  lung]
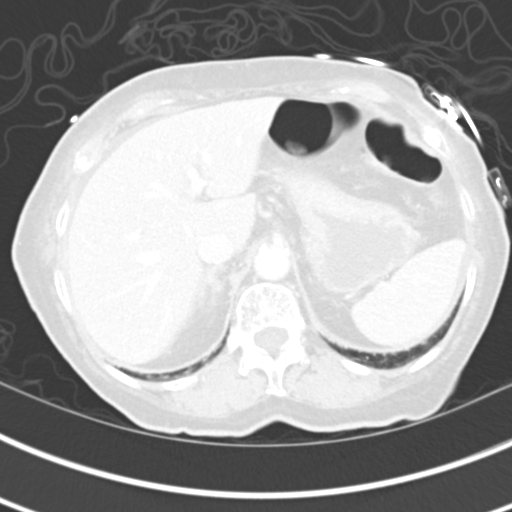
[im 77/86  soft-tissue]
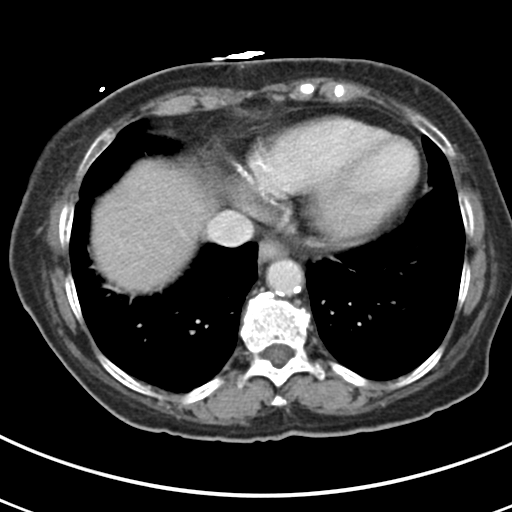
[im 77/86  lung]
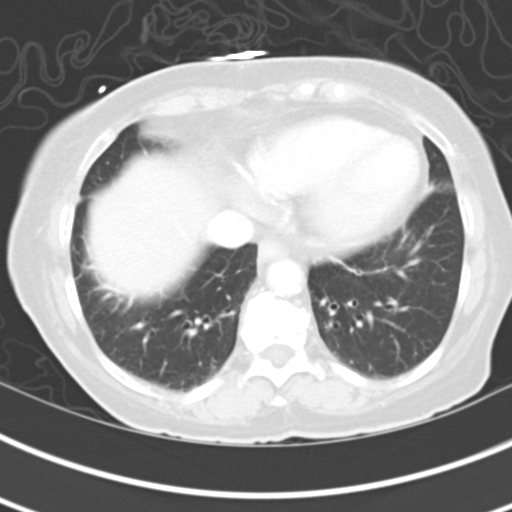

[10 of 46 positions shown; findings below may reference images not displayed]

FINDINGS: VASCULAR

Aorta: Scattered calcification is noted along the abdominal aorta
and its branches. There is no evidence of mural thrombus or
significant luminal narrowing. There is no evidence of aortic
dissection or aneurysmal dilatation.

Celiac: There is slight narrowing at the origin of the celiac trunk,
with mild associated calcification. The celiac trunk is otherwise
patent.

SMA: There is slight narrowing at the origin of the superior
mesenteric artery, with mild associated calcification. The superior
mesenteric artery remains patent.

Renals: There is calcification at the proximal renal arteries
bilaterally, though they remain patent.

IMA: The inferior mesenteric artery is grossly patent.

Inflow: Scattered calcification is noted along the common iliac
arteries and internal iliac arteries bilaterally. The common femoral
arteries are unremarkable in appearance.

Proximal Outflow: The visualized portions of profunda femoris and
superficial femoral arteries are unremarkable.

Veins: The visualized venous structures are grossly unremarkable in
appearance.

Review of the MIP images confirms the above findings.

NON-VASCULAR

Lower chest: Minimal bibasilar atelectasis is noted. Mild
bronchiectasis is noted at both lower lobes. Diffuse coronary artery
calcifications are seen.

Hepatobiliary: The liver is unremarkable in appearance. The
gallbladder is unremarkable in appearance. The common bile duct
remains normal in caliber.

Pancreas: The pancreatic duct is somewhat prominent, measuring up to
4 mm in diameter. The pancreas is otherwise unremarkable.

Spleen: The spleen is unremarkable in appearance.

Adrenals/Urinary Tract: The adrenal glands are unremarkable in
appearance. The kidneys are within normal limits. There is no
evidence of hydronephrosis. No renal or ureteral stones are
identified, though evaluation for stones is limited given contrast
in the renal calyces. No perinephric stranding is seen.

Stomach/Bowel: Note is made of diffuse wall thickening involving the
gastric antrum and pylorus, concerning for acute gastritis. The
gastric fundus is grossly unremarkable.

There is a long segment of wall thickening involving the mid ileum,
with more proximal fecalization of the small bowel, reflecting
associated dysmotility. Underlying mesenteric edema is seen.

Remaining visualized small bowel is unremarkable. The cecum is
flipped anteriorly. The colon is grossly unremarkable in appearance.

Lymphatic: No retroperitoneal or pelvic sidewall lymphadenopathy is
seen.

Reproductive: The bladder is mildly distended and grossly
unremarkable. The uterus is grossly unremarkable in appearance. The
ovaries are relatively symmetric. No suspicious adnexal masses are
seen.

Other: Trace ascites is noted surrounding the liver, and also within
the pelvis.

Musculoskeletal: No acute osseous abnormalities are identified. The
visualized musculature is unremarkable in appearance.
IMPRESSION: VASCULAR

1. No evidence of aortic dissection. No evidence of aneurysmal
dilatation. Scattered calcification along the abdominal aorta and
its branches.
2. Slight narrowing at the origin of the celiac trunk and superior
mesenteric artery, with mild associated calcification. Calcification
at the proximal renal arteries bilaterally.

NON-VASCULAR

1. Diffuse wall thickening involving the gastric antrum and pylorus,
concerning for acute gastritis. Would correlate with the patient's
symptoms.
2. Long segment of wall thickening involving the mid ileum, with
more proximal fecalization of the small bowel, reflecting associated
dysmotility. Underlying mesenteric edema noted. This may reflect
acute infectious or inflammatory ileitis. The appearance is less
typical for ischemia, though it cannot be excluded.
3. Prominence of the pancreatic duct to 4 mm, of uncertain
significance. Would correlate with pancreatic lab values.
4. Mild bronchiectasis at the lower lung lobes, with minimal
bibasilar atelectasis.
5. Diffuse coronary artery calcifications seen.
6. Trace ascites noted within the abdomen and pelvis.

## 2022-04-22 DEATH — deceased
